# Patient Record
Sex: Male | Born: 1955 | Race: White | Hispanic: No | Marital: Married | Smoking: Never smoker
Health system: Southern US, Community
[De-identification: ages and names within clinical notes are randomized; demographics above are authoritative.]

## PROBLEM LIST (undated history)

## (undated) DIAGNOSIS — I471 Supraventricular tachycardia, unspecified: Secondary | ICD-10-CM

## (undated) DIAGNOSIS — I499 Cardiac arrhythmia, unspecified: Secondary | ICD-10-CM

## (undated) HISTORY — DX: Supraventricular tachycardia, unspecified: I47.10

## (undated) HISTORY — PX: EXTERNAL EAR SURGERY: SHX627

---

## 2020-12-31 ENCOUNTER — Emergency Department (HOSPITAL_BASED_OUTPATIENT_CLINIC_OR_DEPARTMENT_OTHER)
Admission: EM | Admit: 2020-12-31 | Discharge: 2021-01-01 | Disposition: A | Discharge status: Home / Self Care | Payer: BC Managed Care – PPO | Attending: Emergency Medicine | Admitting: Emergency Medicine

## 2020-12-31 ENCOUNTER — Encounter (HOSPITAL_BASED_OUTPATIENT_CLINIC_OR_DEPARTMENT_OTHER): Payer: Self-pay | Admitting: Emergency Medicine

## 2020-12-31 ENCOUNTER — Emergency Department (HOSPITAL_BASED_OUTPATIENT_CLINIC_OR_DEPARTMENT_OTHER): Payer: BC Managed Care – PPO

## 2020-12-31 ENCOUNTER — Other Ambulatory Visit: Payer: Self-pay

## 2020-12-31 DIAGNOSIS — R0602 Shortness of breath: Secondary | ICD-10-CM

## 2020-12-31 DIAGNOSIS — J069 Acute upper respiratory infection, unspecified: Secondary | ICD-10-CM | POA: Insufficient documentation

## 2020-12-31 HISTORY — DX: Cardiac arrhythmia, unspecified: I49.9

## 2020-12-31 LAB — COMPREHENSIVE METABOLIC PANEL
ALT: 81 U/L — ABNORMAL HIGH (ref 0–44)
AST: 45 U/L — ABNORMAL HIGH (ref 15–41)
Albumin: 3.4 g/dL — ABNORMAL LOW (ref 3.5–5.0)
Alkaline Phosphatase: 79 U/L (ref 38–126)
Anion gap: 13 (ref 5–15)
BUN: 8 mg/dL (ref 8–23)
CO2: 25 mmol/L (ref 22–32)
Calcium: 8.7 mg/dL — ABNORMAL LOW (ref 8.9–10.3)
Chloride: 98 mmol/L (ref 98–111)
Creatinine, Ser: 0.87 mg/dL (ref 0.61–1.24)
GFR, Estimated: >60 mL/min (ref >60)
Glucose, Bld: 125 mg/dL — ABNORMAL HIGH (ref 70–99)
Potassium: 3.5 mmol/L (ref 3.5–5.1)
Sodium: 136 mmol/L (ref 135–145)
Total Bilirubin: 0.9 mg/dL (ref 0.3–1.2)
Total Protein: 7.6 g/dL (ref 6.5–8.1)

## 2020-12-31 LAB — CBC
HCT: 41.5 % (ref 39.0–52.0)
Hemoglobin: 14.3 g/dL (ref 13.0–17.0)
MCH: 32.6 pg (ref 26.0–34.0)
MCHC: 34.5 g/dL (ref 30.0–36.0)
MCV: 94.5 fL (ref 80.0–100.0)
Platelets: 386 10*3/uL (ref 150–400)
RBC: 4.39 MIL/uL (ref 4.22–5.81)
RDW: 11.9 % (ref 11.5–15.5)
WBC: 8.6 10*3/uL (ref 4.0–10.5)
nRBC: 0 % (ref 0.0–0.2)

## 2020-12-31 LAB — D-DIMER, QUANTITATIVE: D-Dimer, Quant: 1.09 ug/mL-FEU — ABNORMAL HIGH (ref 0.00–0.50)

## 2020-12-31 LAB — TROPONIN I (HIGH SENSITIVITY): Troponin I (High Sensitivity): 6 ng/L (ref <18)

## 2020-12-31 MED ORDER — IOHEXOL 350 MG/ML SOLN
100.0000 mL | Freq: Once | INTRAVENOUS | Status: AC | PRN
  Administered 2020-12-31: 100 mL via INTRAVENOUS

## 2020-12-31 MED ORDER — ALBUTEROL SULFATE HFA 108 (90 BASE) MCG/ACT IN AERS: 2.0000 | INHALATION_SPRAY | RESPIRATORY_TRACT | Status: DC | PRN

## 2020-12-31 NOTE — ED Provider Notes (Signed)
[facility] EMERGENCY DEPARTMENT Provider Note   CSN: [id number] Arrival date & time: 12/31/20  1545     History Chief Complaint  Patient presents with  . Shortness of Breath    Travis Trujillo is a 65 y.o. male.  Patient presents with chief complaint of cough congestion and intermittent fevers.  He said Covid for more than 2 weeks now but has persistent cough and shortness of breath.  He was seen by his primary care doctor, sent here for further evaluation.  Denies any chest pain or abdominal pain.  States he has no appetite and feels generalized weakness.  Denies vomiting or diarrhea.        Past Medical History:  Diagnosis Date  . Irregular cardiac rhythm     There are no problems to display for this patient.      No family history on file.  Social History   Tobacco Use  . Smoking status: Never Smoker  . Smokeless tobacco: Never Used  Substance Use Topics  . Alcohol use: Never  . Drug use: Never    Home Medications Prior to Admission medications   Not on File    Allergies    Patient has no known allergies.  Review of Systems   Review of Systems  Constitutional: Positive for fever.  HENT: Negative for ear pain and sore throat.   Eyes: Negative for pain.  Respiratory: Positive for cough and shortness of breath.   Cardiovascular: Negative for chest pain.  Gastrointestinal: Negative for abdominal pain.  Genitourinary: Negative for flank pain.  Musculoskeletal: Negative for back pain.  Skin: Negative for color change and rash.  Neurological: Negative for syncope.  All other systems reviewed and are negative.   Physical Exam Updated Vital Signs BP 129/88   Pulse 96   Temp 98.5 F (36.9 C) (Oral)   Resp 17   Ht 5\' 10"  (1.778 m)   Wt 61.2 kg   SpO2 96%   BMI 19.37 kg/m   Physical Exam Constitutional:      General: He is not in acute distress.    Appearance: He is well-developed.  HENT:     Head: Normocephalic.     Nose: Nose  normal.  Eyes:     Extraocular Movements: Extraocular movements intact.  Cardiovascular:     Rate and Rhythm: Tachycardia present.  Pulmonary:     Effort: Pulmonary effort is normal.  Skin:    Coloration: Skin is not jaundiced.  Neurological:     Mental Status: He is alert. Mental status is at baseline.     ED Results / Procedures / Treatments   Labs (all labs ordered are listed, but only abnormal results are displayed) Labs Reviewed  COMPREHENSIVE METABOLIC PANEL - Abnormal; Notable for the following components:      Result Value   Glucose, Bld 125 (*)    Calcium 8.7 (*)    Albumin 3.4 (*)    AST 45 (*)    ALT 81 (*)    All other components within normal limits  D-DIMER, QUANTITATIVE (NOT AT [facility]) - Abnormal; Notable for the following components:   D-Dimer, Quant 1.09 (*)    All other components within normal limits  CBC  TROPONIN I (HIGH SENSITIVITY)  TROPONIN I (HIGH SENSITIVITY)    EKG None  Radiology CT Angio Chest PE W/Cm &/Or Wo Cm  Result Date: 12/31/2020 CLINICAL DATA:  Recent COVID infection.  Persistent cough and fever. EXAM: CT ANGIOGRAPHY CHEST WITH CONTRAST TECHNIQUE:  Multidetector CT imaging of the chest was performed using the standard protocol during bolus administration of intravenous contrast. Multiplanar CT image reconstructions and MIPs were obtained to evaluate the vascular anatomy. CONTRAST:  OMNIPAQUE IOHEXOL 350 MG/ML SOLN COMPARISON:  None. FINDINGS: Cardiovascular: Contrast injection is sufficient to demonstrate satisfactory opacification of the pulmonary arteries to the segmental level. There is no pulmonary embolus or evidence of right heart strain. The size of the main pulmonary artery is normal. Heart size is normal, with no pericardial effusion. The course and caliber of the aorta are normal. There is no atherosclerotic calcification. Opacification decreased due to pulmonary arterial phase contrast bolus timing. Mediastinum/Nodes: -- No  mediastinal lymphadenopathy. -- No hilar lymphadenopathy. -- No axillary lymphadenopathy. -- No supraclavicular lymphadenopathy. -- Normal thyroid gland where visualized. -  Unremarkable esophagus. Lungs/Pleura: Scattered ground-glass airspace opacities are noted, primarily within the lower lobes. There is no pneumothorax or large pleural effusion. There is a pulmonary nodule at the right lung apex measuring approximately 5 mm (axial series 5, image 15). Upper Abdomen: Contrast bolus timing is not optimized for evaluation of the abdominal organs. The visualized portions of the organs of the upper abdomen are normal. Musculoskeletal: No chest wall abnormality. No bony spinal canal stenosis. Review of the MIP images confirms the above findings. IMPRESSION: 1. No evidence of pulmonary embolus. 2. Scattered ground-glass airspace opacities, primarily within the lower lobes, consistent with an atypical infectious process such as viral pneumonia. 3. There is a 5 mm pulmonary nodule at the right lung apex. No follow-up needed if patient is low-risk. Non-contrast chest CT can be considered in 12 months if patient is high-risk. This recommendation follows the consensus statement: Guidelines for Management of Incidental Pulmonary Nodules Detected on CT Images: From the Fleischner Society 2017; Radiology 2017; 284:228-243. Electronically Signed   By: Katherine Mantle M.D.   On: 12/31/2020 23:19   DG Chest Portable 1 View  Result Date: 12/31/2020 CLINICAL DATA:  Cough, COVID EXAM: PORTABLE CHEST 1 VIEW COMPARISON:  12/26/2020 FINDINGS: The heart size and mediastinal contours are within normal limits. Both lungs are clear. The visualized skeletal structures are unremarkable. IMPRESSION: Normal study. Electronically Signed   By: Charlett Nose M.D.   On: 12/31/2020 20:03    Procedures Procedures (including critical care time)  Medications Ordered in ED Medications  albuterol (VENTOLIN HFA) 108 (90 Base) MCG/ACT inhaler  2 puff (has no administration in time range)  iohexol (OMNIPAQUE) 350 MG/ML injection 100 mL (100 mLs Intravenous Contrast Given 12/31/20 2306)    ED Course  I have reviewed the triage vital signs and the nursing notes.  Pertinent labs & imaging results that were available during my care of the patient were reviewed by me and considered in my medical decision making (see chart for details).    MDM Rules/Calculators/A&P                          Patient with recent Covid diagnosis with ongoing symptoms greater than 2 weeks.  D-dimer is elevated unclear if due to Covid or other etiology.  CT angio pursued no evidence of pulmonary embolism.  Patient otherwise has normal vital signs blood pressure 120 systolic and O2 saturation 97% room air which is appropriate.  Will recommend continued follow-up with his primary care doctor and management on outpatient basis.  Advised immediate return if he has trouble breathing pain fevers or any additional concerns or worsening symptoms.   Final Clinical  Impression(s) / ED Diagnoses Final diagnoses:  Shortness of breath  Viral URI    Rx / DC Orders ED Discharge Orders    None       Cheryll Cockayne, MD 12/31/20 2355

## 2020-12-31 NOTE — ED Triage Notes (Signed)
States is 15 days post dx of covid  And saw dr today and was told he has pneumonia , pt still has cough and fever and has a lot of nausea

## 2020-12-31 NOTE — ED Notes (Signed)
Patient denies pain and is resting comfortably.  

## 2020-12-31 NOTE — Discharge Instructions (Addendum)
Call your primary care doctor or specialist as discussed in the next 2-3 days.   Return immediately back to the ER if:  Your symptoms worsen within the next 12-24 hours. You develop new symptoms such as new fevers, persistent vomiting, new pain, shortness of breath, or new weakness or numbness, or if you have any other concerns.  

## 2021-08-27 IMAGING — CT CT ANGIO CHEST
2 of 8 series · 18 of 36 positions shown · IV contrast (Omnipaque)
Comparison: None.

CLINICAL DATA: Recent COVID infection.  Persistent cough and fever.

EXAM:
CT ANGIOGRAPHY CHEST WITH CONTRAST
TECHNIQUE: Multidetector CT imaging of the chest was performed using the
standard protocol during bolus administration of intravenous
contrast. Multiplanar CT image reconstructions and MIPs were
obtained to evaluate the vascular anatomy.
CONTRAST:  100mL OMNIPAQUE IOHEXOL 350 MG/ML SOLN

[Series 6: pe coronal mpr · coronal · 0.69mm/px · 1 of 134 slices shown]
[im 67/134  mediastinal]
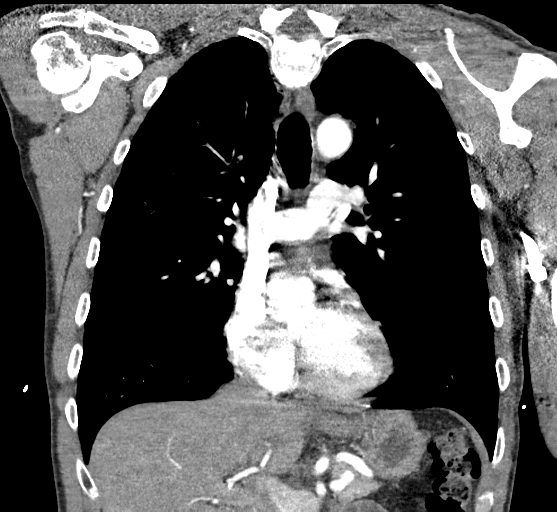

[Series 10: pe thins · axial · 0.67mm/px · z∈[-265,+48]mm · 17 of 351 slices shown]
[im 19/351  lung]
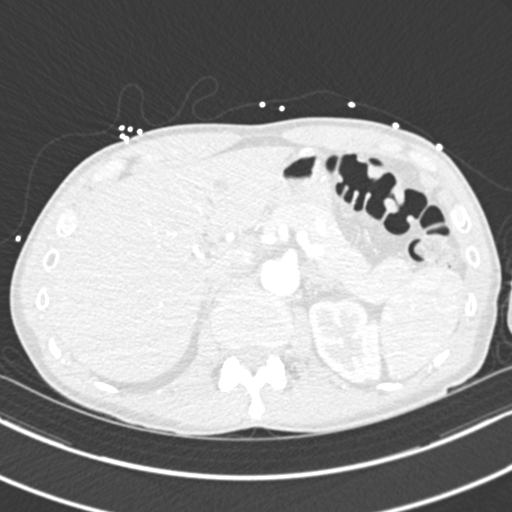
[im 37/351  mediastinal]
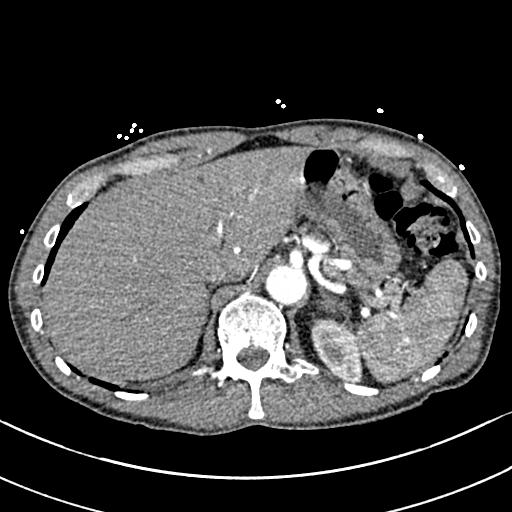
[im 56/351  lung]
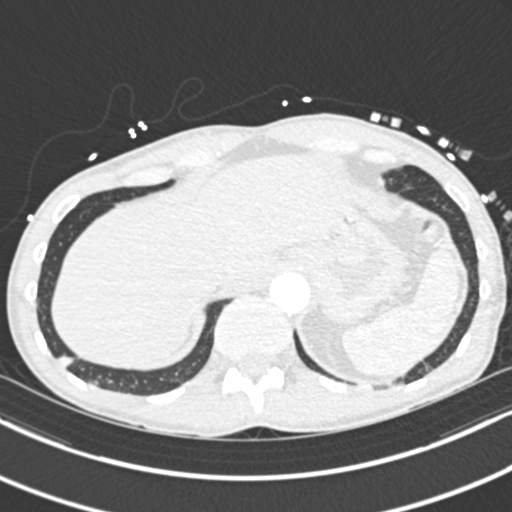
[im 74/351  mediastinal]
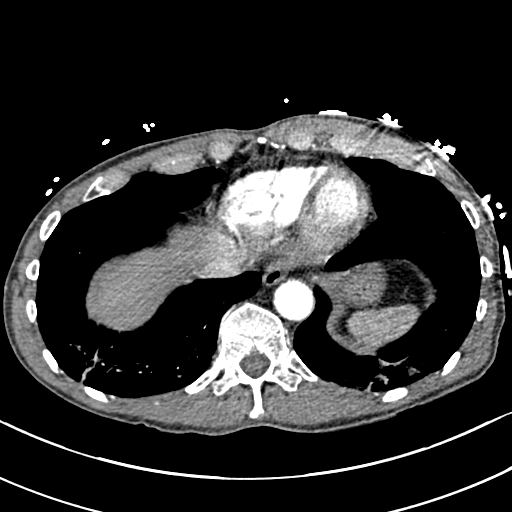
[im 93/351  lung]
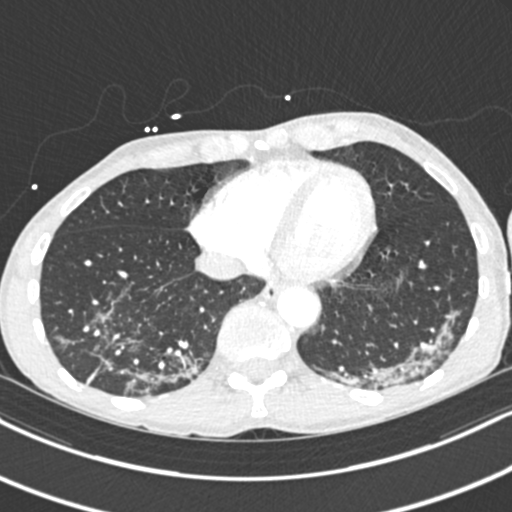
[im 111/351  mediastinal]
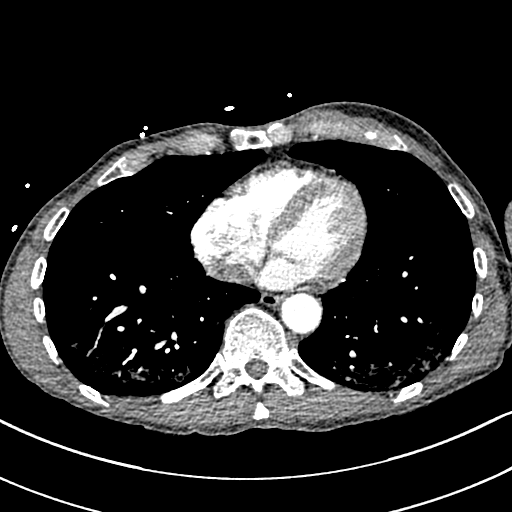
[im 129/351  lung]
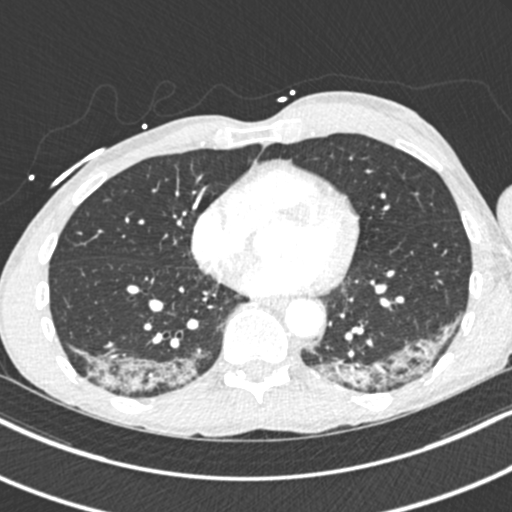
[im 148/351  mediastinal]
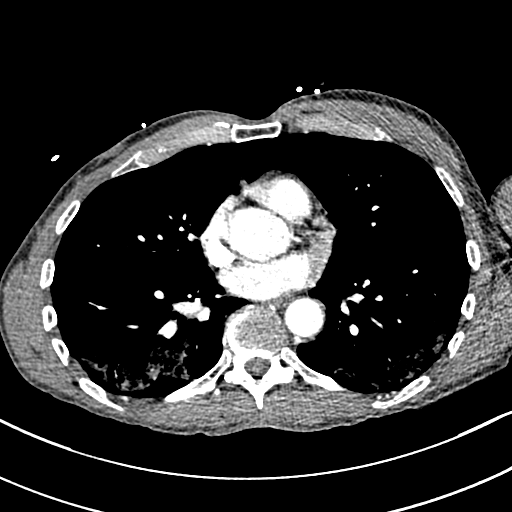
[im 185/351  lung]
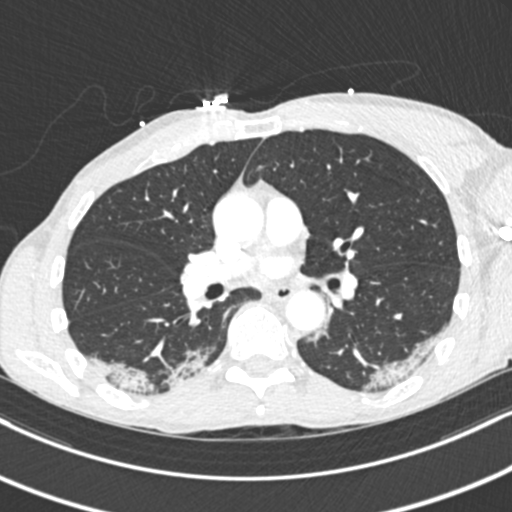
[im 203/351  mediastinal]
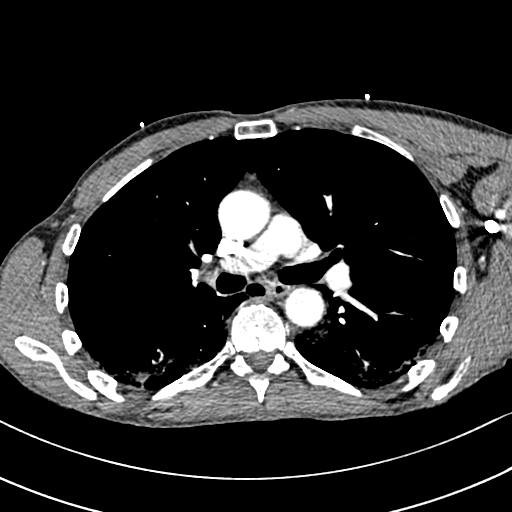
[im 222/351  lung]
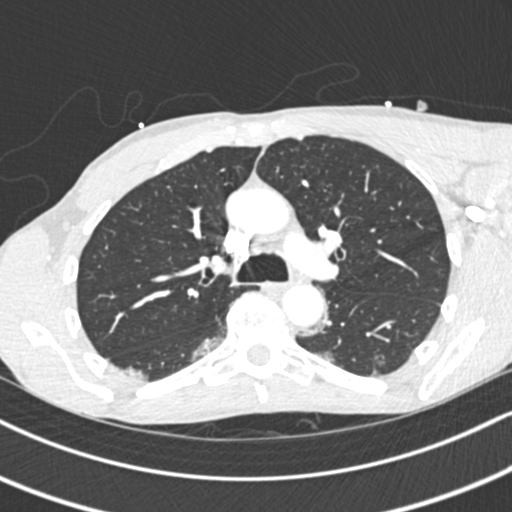
[im 240/351  mediastinal]
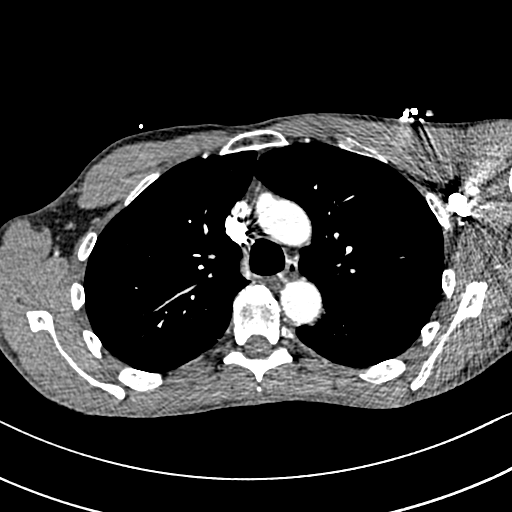
[im 258/351  lung]
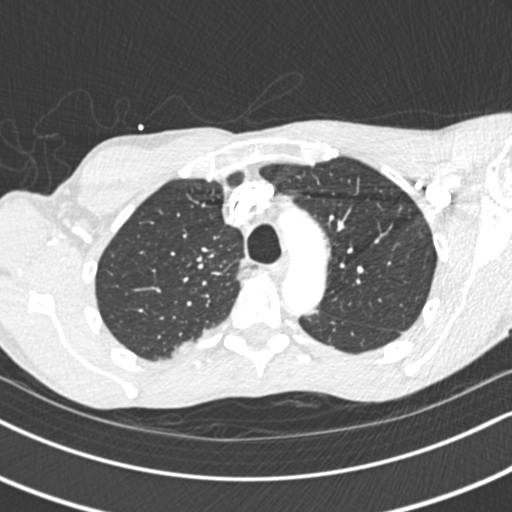
[im 277/351  mediastinal]
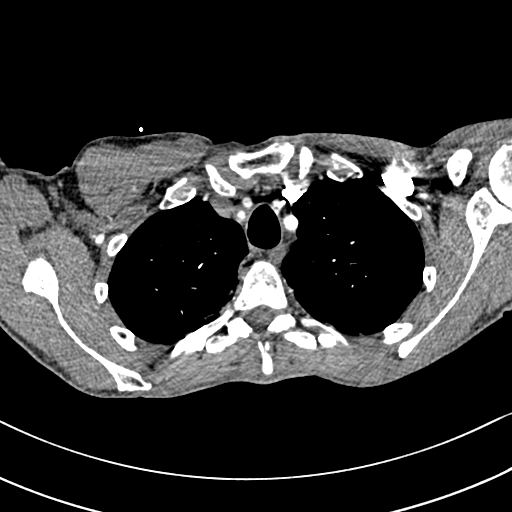
[im 295/351  lung]
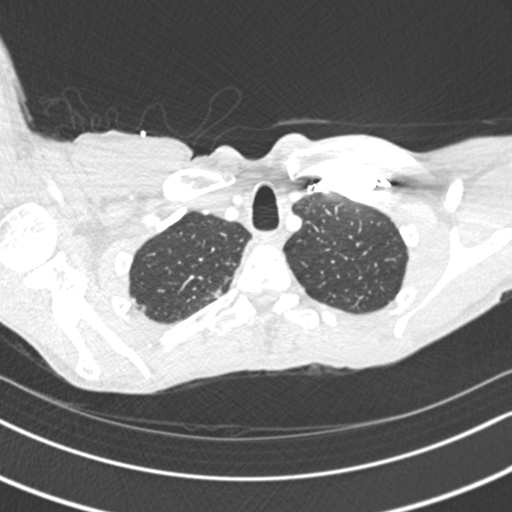
[im 314/351  mediastinal]
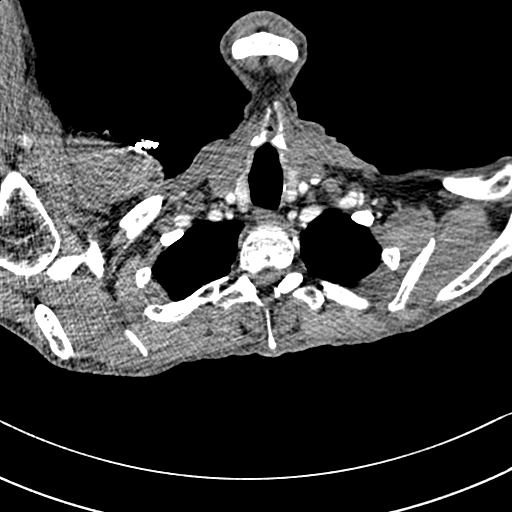
[im 332/351  lung]
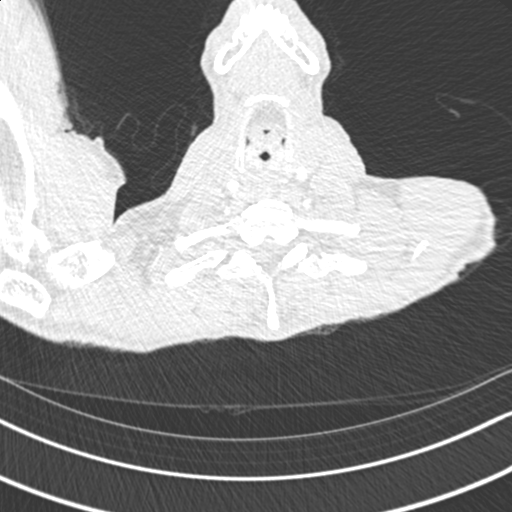

[18 of 36 positions shown; findings below may reference images not displayed]

FINDINGS: Cardiovascular: Contrast injection is sufficient to demonstrate
satisfactory opacification of the pulmonary arteries to the
segmental level. There is no pulmonary embolus or evidence of right
heart strain. The size of the main pulmonary artery is normal. Heart
size is normal, with no pericardial effusion. The course and caliber
of the aorta are normal. There is no atherosclerotic calcification.
Opacification decreased due to pulmonary arterial phase contrast
bolus timing.

Mediastinum/Nodes:

-- No mediastinal lymphadenopathy.

-- No hilar lymphadenopathy.

-- No axillary lymphadenopathy.

-- No supraclavicular lymphadenopathy.

-- Normal thyroid gland where visualized.

-  Unremarkable esophagus.

Lungs/Pleura: Scattered ground-glass airspace opacities are noted,
primarily within the lower lobes. There is no pneumothorax or large
pleural effusion. There is a pulmonary nodule at the right lung apex
measuring approximately 5 mm (axial series 5, image 15).

Upper Abdomen: Contrast bolus timing is not optimized for evaluation
of the abdominal organs. The visualized portions of the organs of
the upper abdomen are normal.

Musculoskeletal: No chest wall abnormality. No bony spinal canal
stenosis.

Review of the MIP images confirms the above findings.
IMPRESSION: 1. No evidence of pulmonary embolus.
2. Scattered ground-glass airspace opacities, primarily within the
lower lobes, consistent with an atypical infectious process such as
viral pneumonia.
3. There is a 5 mm pulmonary nodule at the right lung apex. No
follow-up needed if patient is low-risk. Non-contrast chest CT can
be considered in 12 months if patient is high-risk. This
recommendation follows the consensus statement: Guidelines for
Management of Incidental Pulmonary Nodules Detected on CT Images:

## 2021-10-02 DIAGNOSIS — R0789 Other chest pain: Secondary | ICD-10-CM

## 2021-10-02 DIAGNOSIS — K2900 Acute gastritis without bleeding: Secondary | ICD-10-CM | POA: Insufficient documentation

## 2021-10-02 DIAGNOSIS — B372 Candidiasis of skin and nail: Secondary | ICD-10-CM

## 2021-10-02 HISTORY — DX: Other chest pain: R07.89

## 2021-10-02 HISTORY — DX: Acute gastritis without bleeding: K29.00

## 2021-10-02 HISTORY — DX: Candidiasis of skin and nail: B37.2

## 2021-12-24 DIAGNOSIS — Z800 Family history of malignant neoplasm of digestive organs: Secondary | ICD-10-CM | POA: Diagnosis not present

## 2021-12-24 DIAGNOSIS — Z13220 Encounter for screening for lipoid disorders: Secondary | ICD-10-CM | POA: Diagnosis not present

## 2021-12-24 DIAGNOSIS — Z13228 Encounter for screening for other metabolic disorders: Secondary | ICD-10-CM | POA: Diagnosis not present

## 2021-12-24 DIAGNOSIS — Z8249 Family history of ischemic heart disease and other diseases of the circulatory system: Secondary | ICD-10-CM | POA: Diagnosis not present

## 2021-12-24 DIAGNOSIS — Z8719 Personal history of other diseases of the digestive system: Secondary | ICD-10-CM | POA: Diagnosis not present

## 2021-12-24 DIAGNOSIS — Z23 Encounter for immunization: Secondary | ICD-10-CM | POA: Diagnosis not present

## 2021-12-24 DIAGNOSIS — Z125 Encounter for screening for malignant neoplasm of prostate: Secondary | ICD-10-CM | POA: Diagnosis not present

## 2021-12-24 DIAGNOSIS — Z823 Family history of stroke: Secondary | ICD-10-CM | POA: Diagnosis not present

## 2021-12-24 DIAGNOSIS — Z0000 Encounter for general adult medical examination without abnormal findings: Secondary | ICD-10-CM | POA: Diagnosis not present

## 2022-01-21 DIAGNOSIS — Z23 Encounter for immunization: Secondary | ICD-10-CM | POA: Diagnosis not present

## 2023-10-07 ENCOUNTER — Encounter: Payer: Self-pay | Admitting: Cardiology

## 2023-10-07 ENCOUNTER — Encounter: Payer: Self-pay | Admitting: *Deleted

## 2023-10-13 DIAGNOSIS — I499 Cardiac arrhythmia, unspecified: Secondary | ICD-10-CM | POA: Insufficient documentation

## 2023-10-13 DIAGNOSIS — I471 Supraventricular tachycardia, unspecified: Secondary | ICD-10-CM | POA: Insufficient documentation

## 2023-10-14 NOTE — Progress Notes (Unsigned)
Cardiology Office Note:    Date:  10/15/2023   ID:  Travis Trujillo, DOB 25-Dec-1955, MRN [id number]  PCP:  Hadley Pen, MD  Cardiologist:  Norman Herrlich, MD   Referring MD: Hadley Pen, MD  ASSESSMENT:    1. Palpitations   2. SVT (supraventricular tachycardia) (HCC)   3. Precordial pain   4. SOB (shortness of breath)   5. Syncope and collapse   6. Incomplete RBBB    PLAN:    In order of problems listed above:  He has a complicated history including rapid heartbeat on unsure whether he has clinical SVT, for me to see if I can access that monitor he had 2021 through [facility] Most recently had a profound syncopal episode quite suggestive of bradycardia He is having prolonged episodes of irregular heartbeat with weakness and hypotension quite suggestive of atrial fibrillation and may be having sinus pauses converting from atrial fibrillation This is superimposed on cardiovascular symptoms including chest pain and exertional shortness of breath In order to capture an episode I advise either an implanted loop recorder or a uses the Kardia mobile device he prefers the latter and will start sign up with my chart and send the episodes if you are unable to capture he will need a loop recorder He needs an echocardiogram to assess for structural heart disease with chest pain and shortness of breath He needs an ischemia evaluation with cardiac CTA He also has incomplete right bundle branch block raising concern for structural heart disease Next appointment 3 months   Medication Adjustments/Labs and Tests Ordered: Current medicines are reviewed at length with the patient today.  Concerns regarding medicines are outlined above.  Orders Placed This Encounter  Procedures   CT CORONARY MORPH W/CTA COR W/SCORE W/CA W/CM &/OR WO/CM   Basic Metabolic Panel (BMET)   EKG 12-Lead   ECHOCARDIOGRAM COMPLETE   Meds ordered this encounter  Medications   metoprolol  tartrate (LOPRESSOR) 100 MG tablet    Sig: Take 1 tablet (100 mg total) by mouth once for 1 dose. Please take this medication 2 hours before CT.    Dispense:  1 tablet    Refill:  0     History of Present Illness:    Travis Trujillo is a 67 y.o. male who is being seen today for the evaluation of SVT at the request of Hadley Pen, MD.  He was seen with his primary care physician 09/30/23 with episodes of lightheadedness and a background history of monitor detected SVT.  He previously had taken a beta-blocker until July when he had an episode of syncope and it was discontinued.  Recent labs 09/30/2023 showed potassium 4.6 sodium 138 creatinine 1.24 GFR 64 cc/min he had mild CKD hemoglobin 15.4 platelets 231,000  His cardiac history begins in 2021 he had an episode of rapid irregular heartbeat subsequently wore monitor his wife was good healthcare knowledge 12 May showed SVT put on beta-blocker and had no episodes until July of this year July of this year after vigorous activity he felt weak he lost consciousness had amnesia for the episode he had bruising and apparently was quite a distance from where he initiated and has no recall of events.  He did not have any ictal activity did not bite his tongue and did not have incontinence he has no history of head trauma or seizure disorder and really no known history of heart disease He is quite vigorous and athletic at times he  gets nonexertional chest pain he describes as tightness attributed to GERD and at times he has exertional shortness of breath that he is able to walk through He has not had an ischemia or cardiomyopathy evaluation He is having intermittent episodes that can last up to a day worse pulse is rapid blood pressure is as low as 85 systolic and he feels quite weak He and his wife are concerned he is having atrial fibrillation. She has an Scientist, physiological for self but has not set it up for EKG.  From office note his PCP 11/05/2020 his  monitor showed 6 short runs of APCs the longest 17 beats in duration was placed on a beta-blocker. Past Medical History:  Diagnosis Date   Acute gastritis without hemorrhage 10/02/2021   Atypical chest pain 10/02/2021   Irregular cardiac rhythm    SVT (supraventricular tachycardia) (HCC)    Yeast dermatitis 10/02/2021    Past Surgical History:  Procedure Laterality Date   EXTERNAL EAR SURGERY      Current Medications: Current Meds  Medication Sig   aspirin EC 81 MG tablet Take 81 mg by mouth daily.   famotidine (PEPCID) 40 MG tablet Take 40 mg by mouth daily.   metoprolol tartrate (LOPRESSOR) 100 MG tablet Take 1 tablet (100 mg total) by mouth once for 1 dose. Please take this medication 2 hours before CT.   Omega-3 1000 MG CAPS Take 1 capsule by mouth daily.   zinc gluconate 50 MG tablet Take 50 mg by mouth daily.     Allergies:   Patient has no known allergies.   Social History   Socioeconomic History   Marital status: Married    Spouse name: Not on file   Number of children: Not on file   Years of education: Not on file   Highest education level: Not on file  Occupational History   Not on file  Tobacco Use   Smoking status: Never   Smokeless tobacco: Never  Substance and Sexual Activity   Alcohol use: Never   Drug use: Never   Sexual activity: Not on file  Other Topics Concern   Not on file  Social History Narrative   Not on file   Social Determinants of Health   Financial Resource Strain: Not on file  Food Insecurity: Low Risk  (09/30/2023)   Received from [facility]   Hunger Vital Sign    Worried About Running Out of Food in the Last Year: Never true    Ran Out of Food in the Last Year: Never true  Transportation Needs: No Transportation Needs (09/30/2023)   Received from [facility]    In the past 12 months, has lack of reliable transportation kept you from medical appointments, meetings, work or from getting things needed for  daily living? : No  Physical Activity: Not on file  Stress: Not on file  Social Connections: Not on file     Family History: The patient's family history includes Colon cancer in his father and paternal grandfather; Hypertension in his mother; Stroke in his mother.  ROS:   ROS Please see the history of present illness.     All other systems reviewed and are negative.  EKGs/Labs/Other Studies Reviewed:    The following studies were reviewed today:  EKG Interpretation Date/Time:  Thursday October 15 2023 08:38:52 EDT Ventricular Rate:  87 PR Interval:  138 QRS Duration:  96 QT Interval:  348 QTC Calculation: 418 R Axis:  79  Text Interpretation: Normal sinus rhythm Incomplete right bundle branch block No previous ECGs available Confirmed by Norman Herrlich ([id number]) on 10/15/2023 9:24:41 AM    Physical Exam:    VS:  BP 116/80   Pulse 87   Ht 5\' 10"  (1.778 m)   Wt 150 lb 3.2 oz (68.1 kg)   SpO2 95%   BMI 21.55 kg/m     Wt Readings from Last 3 Encounters:  10/15/23 150 lb 3.2 oz (68.1 kg)  09/30/23 149 lb (67.6 kg)  12/31/20 135 lb (61.2 kg)     GEN:  Well nourished, well developed in no acute distress HEENT: Normal NECK: No JVD; No carotid bruits LYMPHATICS: No lymphadenopathy CARDIAC: RRR, no murmurs, rubs, gallops RESPIRATORY:  Clear to auscultation without rales, wheezing or rhonchi  ABDOMEN: Soft, non-tender, non-distended MUSCULOSKELETAL:  No edema; No deformity  SKIN: Warm and dry NEUROLOGIC:  Alert and oriented x 3 PSYCHIATRIC:  Normal affect     Signed, Norman Herrlich, MD  10/15/2023 9:29 AM    [facility]

## 2023-10-15 ENCOUNTER — Ambulatory Visit: Payer: PPO | Attending: Cardiology | Admitting: Cardiology

## 2023-10-15 ENCOUNTER — Encounter: Payer: Self-pay | Admitting: Cardiology

## 2023-10-15 VITALS — BP 116/80 | HR 87 | Ht 70.0 in | Wt 150.2 lb

## 2023-10-15 DIAGNOSIS — R072 Precordial pain: Secondary | ICD-10-CM

## 2023-10-15 DIAGNOSIS — R002 Palpitations: Secondary | ICD-10-CM

## 2023-10-15 DIAGNOSIS — R55 Syncope and collapse: Secondary | ICD-10-CM

## 2023-10-15 DIAGNOSIS — I4510 Unspecified right bundle-branch block: Secondary | ICD-10-CM

## 2023-10-15 DIAGNOSIS — R0602 Shortness of breath: Secondary | ICD-10-CM

## 2023-10-15 DIAGNOSIS — I471 Supraventricular tachycardia, unspecified: Secondary | ICD-10-CM

## 2023-10-15 LAB — BASIC METABOLIC PANEL
BUN/Creatinine Ratio: 18 (ref 10–24)
BUN: 20 mg/dL (ref 8–27)
CO2: 27 mmol/L (ref 20–29)
Calcium: 9.6 mg/dL (ref 8.6–10.2)
Chloride: 103 mmol/L (ref 96–106)
Creatinine, Ser: 1.1 mg/dL (ref 0.76–1.27)
Glucose: 90 mg/dL (ref 70–99)
Potassium: 4.9 mmol/L (ref 3.5–5.2)
Sodium: 139 mmol/L (ref 134–144)
eGFR: 74 mL/min/{1.73_m2} (ref >59)

## 2023-10-15 MED ORDER — METOPROLOL TARTRATE 100 MG PO TABS: 100.0000 mg | ORAL_TABLET | Freq: Once | ORAL | 0 refills | Status: DC

## 2023-10-15 NOTE — Patient Instructions (Signed)
Medication Instructions:  Your physician recommends that you continue on your current medications as directed. Please refer to the Current Medication list given to you today.  *If you need a refill on your cardiac medications before your next appointment, please call your pharmacy*   Lab Work: Your physician recommends that you return for lab work in:   Labs today: BMP  If you have labs (blood work) drawn today and your tests are completely normal, you will receive your results only by: MyChart Message (if you have MyChart) OR A paper copy in the mail If you have any lab test that is abnormal or we need to change your treatment, we will call you to review the results.   Testing/Procedures: Your physician has requested that you have an echocardiogram. Echocardiography is a painless test that uses sound waves to create images of your heart. It provides your doctor with information about the size and shape of your heart and how well your heart's chambers and valves are working. This procedure takes approximately one hour. There are no restrictions for this procedure. Please do NOT wear cologne, perfume, aftershave, or lotions (deodorant is allowed). Please arrive 15 minutes prior to your appointment time.    Your cardiac CT will be scheduled at one of the below locations:   [facility] [address] [place], [place] [address] [phone]  OR  [facility] [address] [place], [place] [address] [phone]  OR   [facility] [address] [place], [place] [address] [phone]  If scheduled at [facility], please arrive at the [facility] and Children's Entrance (Entrance C2) of [facility] 30 minutes prior to test start time. You can use the FREE valet parking offered at entrance C (encouraged to control the heart rate for the test)  Proceed to the [facility] Radiology  Department (first floor) to check-in and test prep.  All radiology patients and guests should use entrance C2 at [facility], accessed from [facility], even though the hospital's physical address listed is [address]    If scheduled at [facility] or [facility], please arrive 15 mins early for check-in and test prep.  There is spacious parking and easy access to the radiology department from the [facility] V[facility] entrance. Please enter here and check-in with the desk attendant.   Please follow these instructions carefully (unless otherwise directed):  An IV will be required for this test and Nitroglycerin will be given.  Hold all erectile dysfunction medications at least 3 days (72 hrs) prior to test. (Ie viagra, cialis, sildenafil, tadalafil, etc)   On the Night Before the Test: Be sure to Drink plenty of water. Do not consume any caffeinated/decaffeinated beverages or chocolate 12 hours prior to your test. Do not take any antihistamines 12 hours prior to your test.  On the Day of the Test: Drink plenty of water until 1 hour prior to the test. Do not eat any food 1 hour prior to test. You may take your regular medications prior to the test.  Take metoprolol (Lopressor) two hours prior to test.      After the Test: Drink plenty of water. After receiving IV contrast, you may experience a mild flushed feeling. This is normal. On occasion, you may experience a mild rash up to 24 hours after the test. This is not dangerous. If this occurs, you  can take Benadryl 25 mg and increase your fluid intake. If you experience trouble breathing, this can be serious. If it is severe call 911 IMMEDIATELY. If it is mild, please call our office.  We will call to schedule your test 2-4 weeks out understanding that some insurance companies will need an authorization prior to the service being performed.   For more  information and frequently asked questions, please visit our website : [email or web address]  For non-scheduling related questions, please contact the cardiac imaging nurse navigator should you have any questions/concerns: Cardiac Imaging Nurse Navigators Direct Office Dial: [phone]   For scheduling needs, including cancellations and rescheduling, please call [place], [phone].    Follow-Up: At [facility], you and your health needs are our priority.  As part of our continuing mission to provide you with exceptional heart care, we have created designated Provider Care Teams.  These Care Teams include your primary Cardiologist (physician) and Advanced Practice Providers (APPs -  Physician Assistants and Nurse Practitioners) who all work together to provide you with the care you need, when you need it.  We recommend signing up for the patient portal called "MyChart".  Sign up information is provided on this After Visit Summary.  MyChart is used to connect with patients for Virtual Visits (Telemedicine).  Patients are able to view lab/test results, encounter notes, upcoming appointments, etc.  Non-urgent messages can be sent to your provider as well.   To learn more about what you can do with MyChart, go to [email or web address].    Your next appointment:   3 month(s)  Provider:   Norman Herrlich, MD    Other Instructions None

## 2023-10-29 ENCOUNTER — Encounter (HOSPITAL_COMMUNITY): Payer: Self-pay

## 2023-11-03 ENCOUNTER — Ambulatory Visit (HOSPITAL_COMMUNITY)
Admission: RE | Admit: 2023-11-03 | Discharge: 2023-11-03 | Disposition: A | Discharge status: Home / Self Care | Payer: PPO | Source: Ambulatory Visit | Attending: Cardiology | Admitting: Cardiology

## 2023-11-03 DIAGNOSIS — R072 Precordial pain: Secondary | ICD-10-CM | POA: Insufficient documentation

## 2023-11-03 DIAGNOSIS — R931 Abnormal findings on diagnostic imaging of heart and coronary circulation: Secondary | ICD-10-CM | POA: Insufficient documentation

## 2023-11-03 DIAGNOSIS — I2510 Atherosclerotic heart disease of native coronary artery without angina pectoris: Secondary | ICD-10-CM | POA: Diagnosis not present

## 2023-11-03 MED ORDER — IOPAMIDOL (ISOVUE-370) INJECTION 76%
100.0000 mL | Freq: Once | INTRAVENOUS | Status: AC | PRN
  Administered 2023-11-03: 100 mL via INTRAVENOUS

## 2023-11-03 MED ORDER — NITROGLYCERIN 0.4 MG SL SUBL
SUBLINGUAL_TABLET | SUBLINGUAL | Status: AC
  Filled 2023-11-03: qty 2

## 2023-11-03 MED ORDER — NITROGLYCERIN 0.4 MG SL SUBL
0.8000 mg | SUBLINGUAL_TABLET | Freq: Once | SUBLINGUAL | Status: AC
  Administered 2023-11-03: 0.8 mg via SUBLINGUAL

## 2023-11-04 ENCOUNTER — Telehealth: Payer: Self-pay | Admitting: Cardiology

## 2023-11-04 ENCOUNTER — Encounter: Payer: Self-pay | Admitting: Cardiology

## 2023-11-04 ENCOUNTER — Other Ambulatory Visit: Payer: Self-pay | Admitting: Cardiology

## 2023-11-04 ENCOUNTER — Ambulatory Visit (HOSPITAL_BASED_OUTPATIENT_CLINIC_OR_DEPARTMENT_OTHER)
Admission: RE | Admit: 2023-11-04 | Discharge: 2023-11-04 | Disposition: A | Discharge status: Home / Self Care | Payer: PPO | Source: Ambulatory Visit | Attending: Cardiology | Admitting: Cardiology

## 2023-11-04 ENCOUNTER — Other Ambulatory Visit: Payer: Self-pay

## 2023-11-04 DIAGNOSIS — R931 Abnormal findings on diagnostic imaging of heart and coronary circulation: Secondary | ICD-10-CM | POA: Diagnosis not present

## 2023-11-04 DIAGNOSIS — R072 Precordial pain: Secondary | ICD-10-CM

## 2023-11-04 MED ORDER — ROSUVASTATIN CALCIUM 20 MG PO TABS: 20.0000 mg | ORAL_TABLET | Freq: Every day | ORAL | 3 refills | Status: DC

## 2023-11-04 NOTE — Telephone Encounter (Signed)
Pt would like a c/b in regards to recent test results as to he has questions. Please advise

## 2023-11-06 NOTE — Telephone Encounter (Signed)
Called patient and informed him of Dr. Hulen Shouts interpretation of his Cardiac CTA. Patient verbalized understanding and had no further questions at this time.

## 2023-11-11 ENCOUNTER — Other Ambulatory Visit: Payer: Self-pay

## 2023-11-18 ENCOUNTER — Ambulatory Visit: Payer: PPO | Attending: Cardiology

## 2023-11-18 DIAGNOSIS — R55 Syncope and collapse: Secondary | ICD-10-CM

## 2023-11-18 DIAGNOSIS — R002 Palpitations: Secondary | ICD-10-CM

## 2023-11-18 DIAGNOSIS — I471 Supraventricular tachycardia, unspecified: Secondary | ICD-10-CM

## 2023-11-18 DIAGNOSIS — R072 Precordial pain: Secondary | ICD-10-CM

## 2023-11-18 DIAGNOSIS — R0602 Shortness of breath: Secondary | ICD-10-CM | POA: Diagnosis not present

## 2023-11-18 LAB — ECHOCARDIOGRAM COMPLETE
Area-P 1/2: 3.26 cm2
S' Lateral: 3.4 cm

## 2024-01-06 ENCOUNTER — Other Ambulatory Visit: Payer: Self-pay

## 2024-01-06 DIAGNOSIS — E785 Hyperlipidemia, unspecified: Secondary | ICD-10-CM

## 2024-01-06 LAB — LIPID PANEL
Chol/HDL Ratio: 2.5 {ratio} (ref 0.0–5.0)
Cholesterol, Total: 144 mg/dL (ref 100–199)
HDL: 57 mg/dL (ref >39)
LDL Chol Calc (NIH): 72 mg/dL (ref 0–99)
Triglycerides: 80 mg/dL (ref 0–149)
VLDL Cholesterol Cal: 15 mg/dL (ref 5–40)

## 2024-01-06 LAB — APOLIPOPROTEIN B: Apolipoprotein B: 67 mg/dL (ref <90)

## 2024-01-06 LAB — LIPOPROTEIN A (LPA): Lipoprotein (a): 111.3 nmol/L — ABNORMAL HIGH (ref <75.0)

## 2024-01-06 MED ORDER — REPATHA SURECLICK 140 MG/ML ~~LOC~~ SOAJ: 140.0000 mg | SUBCUTANEOUS | 12 refills | Status: DC

## 2024-01-14 ENCOUNTER — Encounter: Payer: Self-pay | Admitting: Cardiology

## 2024-01-14 NOTE — Progress Notes (Signed)
Cardiology Office Note:    Date:  01/15/2024   ID:  Travis Trujillo, DOB 01-06-1956, MRN [id number]  PCP:  Hadley Pen, MD  Cardiologist:  Norman Herrlich, MD    Referring MD: Hadley Pen, MD    ASSESSMENT:    1. Coronary artery disease of native artery of native heart with stable angina pectoris (HCC)   2. Agatston coronary artery calcium score less than 100   3. Elevated lipoprotein(a)   4. PAT (paroxysmal atrial tachycardia) (HCC)   5. Incomplete RBBB    PLAN:    In order of problems listed above:  Fortunately has mild nonobstructive CAD I think the key here is treating his LP(a) dyslipidemia he would benefit from combined statin and Repatha if unable to get preapproval Zetia as an option I would like to see his ApoB under 60 LP(a) under 50-55 and he tells me he has an upcoming wellness exam with his PCP i.e. ideally should wait 2 months to recheck his lipids   Next appointment: I will plan to see him 1 year particularly as new agents are coming online for LP(a)   Medication Adjustments/Labs and Tests Ordered: Current medicines are reviewed at length with the patient today.  Concerns regarding medicines are outlined above.  Orders Placed This Encounter  Procedures   Lipid Profile   Lipoprotein A (LPA)   Apolipoprotein B   No orders of the defined types were placed in this encounter.    History of Present Illness:    Travis Trujillo is a 68 y.o. male with a hx of palpitation brief atrial tachycardia on the monitor episode of syncope incomplete right bundle branch and chest pain last seen 10/15/2023.  Subsequent evaluation showed the presence of CAD and hyperlipidemia with elevated LP(a) and is currently on both a high intensity statin and Repatha.  Following the visit he had a cardiac CTA reported 11/04/2023 he had a coronary calcium score 42 which is 42nd percentile for age and sex plaque volume 22nd percentile and he had moderate nonflow limiting stenosis in the  left anterior descending coronary artery prior to the first diagonal branch 50 to 69% FFR normal.  His echocardiogram performed 11/18/2023 showed normal left ventricular size and function.  Size wall thickness systolic function EF 60 to 65% normal diastolic function normal GLS.  Right ventricle normal size function and pulmonary artery pressure there is mild left atrial enlargement.  His lipid profile 2 weeks ago showed elevated LP(a) at 100 and his APO B was relatively low at 25 with an LDL of 72 and he is advised use of PCSK9 inhibitor  Compliance with diet, lifestyle and medications: Yes  His wife is present participates in evaluation decision making Is a great deal of research about lipid disorders and LP(a) i advised Repatha and he alerts me he needs preapproval He tolerated his statin without muscle pain or weakness We reviewed his CTA and echocardiogram Not having chest pain edema shortness of breath Past Medical History:  Diagnosis Date   Acute gastritis without hemorrhage 10/02/2021   Atypical chest pain 10/02/2021   Irregular cardiac rhythm    SVT (supraventricular tachycardia) (HCC)    Yeast dermatitis 10/02/2021    Current Medications: Current Meds  Medication Sig   aspirin EC 81 MG tablet Take 81 mg by mouth daily.   Evolocumab (REPATHA SURECLICK) 140 MG/ML SOAJ Inject 140 mg into the skin every 14 (fourteen) days.   famotidine (PEPCID) 40 MG tablet Take 40 mg by  mouth daily.   Omega-3 1000 MG CAPS Take 1 capsule by mouth daily.   rosuvastatin (CRESTOR) 20 MG tablet Take 1 tablet (20 mg total) by mouth daily.   zinc gluconate 50 MG tablet Take 50 mg by mouth daily.   [DISCONTINUED] metoprolol tartrate (LOPRESSOR) 100 MG tablet Take 1 tablet (100 mg total) by mouth once for 1 dose. Please take this medication 2 hours before CT.      EKGs/Labs/Other Studies Reviewed:    The following studies were reviewed today:  Cardiac Studies & Procedures       ECHOCARDIOGRAM  ECHOCARDIOGRAM COMPLETE 11/18/2023  Narrative ECHOCARDIOGRAM REPORT    Patient Name:   Travis Trujillo Date of Exam: 11/18/2023 Medical Rec #:  [id number]    Height:       70.0 in Accession #:    [id number]   Weight:       150.2 lb Date of Birth:  1956/07/07   BSA:          1.848 m Patient Age:    67 years     BP:           99/77 mmHg Patient Gender: M            HR:           80 bpm. Exam Location:  [place]  Procedure: 2D Echo, Cardiac Doppler, Color Doppler and Strain Analysis  Indications:    SVT (supraventricular tachycardia) (HCC) [I47.10 (ICD-10-CM)]; Precordial pain [R07.2 (ICD-10-CM)]; SOB (shortness of breath) [R06.02 (ICD-10-CM)]; Syncope and collapse Vera.August (ICD-10-CM)]; Palpitations [R00.2 (ICD-10-CM)]  History:        Patient has no prior history of Echocardiogram examinations. Arrythmias:SVT and RBBB; Signs/Symptoms:Shortness of Breath and Syncope.  Sonographer:    Margreta Journey RDCS Referring Phys: [id number] Tyvon Eggenberger J Nickole Adamek  IMPRESSIONS   1. Left ventricular ejection fraction, by estimation, is 60 to 65%. The left ventricle has normal function. The left ventricle has no regional wall motion abnormalities. Left ventricular diastolic parameters were normal. The average left ventricular global longitudinal strain is -20.6 %. 2. Right ventricular systolic function is normal. The right ventricular size is normal. There is normal pulmonary artery systolic pressure. 3. Right atrial size was mildly dilated. 4. The mitral valve is grossly normal. Trivial mitral valve regurgitation. No evidence of mitral stenosis. 5. The aortic valve is tricuspid. There is mild focal calcification of the noncoronary cusp of the aortic valve. There is moderate thickening of the aortic valve. Aortic valve regurgitation is not visualized. No aortic stenosis is present. 6. There is mild dilatation of the ascending aorta, measuring 37 mm. 7. The inferior vena cava is normal in size  with greater than 50% respiratory variability, suggesting right atrial pressure of 3 mmHg.  Comparison(s): No prior Echocardiogram.  FINDINGS Left Ventricle: Left ventricular ejection fraction, by estimation, is 60 to 65%. The left ventricle has normal function. The left ventricle has no regional wall motion abnormalities. The average left ventricular global longitudinal strain is -20.6 %. The left ventricular internal cavity size was normal in size. There is no left ventricular hypertrophy. Left ventricular diastolic parameters were normal.  Right Ventricle: The right ventricular size is normal. Right vetricular wall thickness was not well visualized. Right ventricular systolic function is normal. There is normal pulmonary artery systolic pressure. The tricuspid regurgitant velocity is 2.28 m/s, and with an assumed right atrial pressure of 3 mmHg, the estimated right ventricular systolic pressure is 23.8 mmHg.  Left Atrium: Left atrial size was  normal in size.  Right Atrium: Right atrial size was mildly dilated.  Pericardium: There is no evidence of pericardial effusion.  Mitral Valve: The mitral valve is grossly normal. Trivial mitral valve regurgitation. No evidence of mitral valve stenosis.  Tricuspid Valve: The tricuspid valve is not well visualized. Tricuspid valve regurgitation is mild . No evidence of tricuspid stenosis.  Aortic Valve: The aortic valve is tricuspid. There is mild focal calcification of the noncoronary cusp of the aortic valve. There is moderate thickening of the aortic valve. Aortic valve regurgitation is not visualized. No aortic stenosis is present.  Pulmonic Valve: The pulmonic valve was not well visualized. Pulmonic valve regurgitation is not visualized. No evidence of pulmonic stenosis.  Aorta: The aortic root is normal in size and structure. There is mild dilatation of the ascending aorta, measuring 37 mm.  Venous: The inferior vena cava is normal in size  with greater than 50% respiratory variability, suggesting right atrial pressure of 3 mmHg.  IAS/Shunts: The interatrial septum was not well visualized.   LEFT VENTRICLE PLAX 2D LVIDd:         4.30 cm   Diastology LVIDs:         3.40 cm   LV e' medial:    7.94 cm/s LV PW:         1.00 cm   LV E/e' medial:  8.1 LV IVS:        1.00 cm   LV e' lateral:   12.70 cm/s LVOT diam:     2.50 cm   LV E/e' lateral: 5.0 LV SV:         84 LV SV Index:   45        2D Longitudinal Strain LVOT Area:     4.91 cm  2D Strain GLS Avg:     -20.6 %   RIGHT VENTRICLE             IVC RV Basal diam:  3.60 cm     IVC diam: 1.50 cm RV Mid diam:    3.00 cm RV S prime:     15.10 cm/s TAPSE (M-mode): 2.9 cm  LEFT ATRIUM           Index        RIGHT ATRIUM           Index LA diam:      2.40 cm 1.30 cm/m   RA Area:     18.30 cm LA Vol (A2C): 37.4 ml 20.24 ml/m  RA Volume:   54.20 ml  29.33 ml/m LA Vol (A4C): 30.5 ml 16.50 ml/m AORTIC VALVE LVOT Vmax:   90.83 cm/s LVOT Vmean:  59.733 cm/s LVOT VTI:    0.171 m  AORTA Ao Root diam: 3.60 cm Ao Asc diam:  3.70 cm Ao Desc diam: 2.40 cm  MITRAL VALVE               TRICUSPID VALVE MV Area (PHT): 3.26 cm    TR Peak grad:   20.8 mmHg MV Decel Time: 233 msec    TR Vmax:        228.00 cm/s MV E velocity: 63.95 cm/s MV A velocity: 79.95 cm/s  SHUNTS MV E/A ratio:  0.80        Systemic VTI:  0.17 m Systemic Diam: 2.50 cm  Sreedhar reddy Madireddy Electronically signed by Vern Claude reddy Madireddy Signature Date/Time: 11/18/2023/5:54:47 PM    Final    CT SCANS  CT CORONARY MORPH W/CTA  COR W/SCORE 11/03/2023  Addendum 11/20/2023  8:27 PM ADDENDUM REPORT: 11/20/2023 20:24  EXAM: OVER-READ INTERPRETATION  CT CHEST  The following report is an over-read performed by radiologist Dr. Aram Candela of [facility] on 11/20/2023. This over-read does not include interpretation of cardiac or coronary anatomy or pathology. The coronary  calcium score/coronary CTA interpretation by the cardiologist is attached.  COMPARISON:  December 31, 2020  FINDINGS: Cardiovascular: There are no significant extracardiac vascular findings.  Mediastinum/Nodes: There are no enlarged lymph nodes within the visualized mediastinum.  Lungs/Pleura: There is no pleural effusion. The visualized lungs appear clear.  Upper abdomen: No significant findings in the visualized upper abdomen.  Musculoskeletal/Chest wall: No chest wall mass or suspicious osseous findings within the visualized chest.  IMPRESSION: No significant extracardiac findings within the visualized chest.   Electronically Signed By: Aram Candela M.D. On: 11/20/2023 20:24  Narrative CLINICAL DATA:  69 year old with chest pain  EXAM: Cardiac/Coronary  CTA  TECHNIQUE: The patient was scanned on a [employer].  FINDINGS: A 120 kV prospective scan was triggered in the descending thoracic aorta at 111 HU's. Axial non-contrast 3 mm slices were carried out through the heart. The data set was analyzed on a dedicated work station and scored using the Agatson method. Gantry rotation speed was 250 msecs and collimation was .6 mm. 0.8 mg of sl NTG was given. The 3D data set was reconstructed in 5% intervals of the 67-82 % of the R-R cycle. Diastolic phases were analyzed on a dedicated work station using MPR, MIP and VRT modes. The patient received 80 cc of contrast.  Image quality: good  Aorta: Normal size. No calcifications. There is a small faint linear signal in the descending aorta that is likely artifactual (not visualized in 35% reconstruction).  Aortic Valve:  Trileaflet.  No calcifications.  Coronary Arteries:  Normal coronary origin.  Right dominance.  RCA is a large dominant artery that gives rise to PDA and PLA. There is no plaque.  Left main is a large artery that gives rise to LAD and LCX arteries.  LAD is a large vessel that has  proximal calcified plaque, 0 to 24% stenosis, mid low-attenuation plaque 50 to 69% stenosis at the bifurcation of diagonal branch. FFR is 0.88 post lesion, non flow limiting.  LCX is a non-dominant artery.  There is no plaque.  OM1-small caliber  Other findings:  Normal pulmonary vein drainage into the left atrium.  Normal left atrial appendage without a thrombus.  Normal size of the pulmonary artery.  Please see radiology report for non cardiac findings.  IMPRESSION: 1. Coronary calcium score of 42. This was 73 percentile for age and sex matched control.  2. Total plaque volume (TPV) 103 mm3 which is 22nd percentile for age-and sex matched controls (calcified plaque 6 mm3; non-calcified plaque 97 mm3). TPV is mild.  3. Normal coronary origin with right dominance.  4. Moderate non flow limiting CAD of LAD just prior to diagonal branch, FFR 0.88 post lesion.  Electronically Signed: By: Donato Schultz M.D. On: 11/04/2023 12:19              Recent Labs: 10/15/2023: BUN 20; Creatinine, Ser 1.10; Potassium 4.9; Sodium 139  Recent Lipid Panel    Component Value Date/Time   CHOL 144 01/04/2024 0949   TRIG 80 01/04/2024 0949   HDL 57 01/04/2024 0949   CHOLHDL 2.5 01/04/2024 0949   LDLCALC 72 01/04/2024 0949    Physical  Exam:    VS:  BP 108/70 (BP Location: Right Arm, Patient Position: Sitting)   Pulse 91   Ht 5\' 10"  (1.778 m)   Wt 152 lb 6.4 oz (69.1 kg)   SpO2 91%   BMI 21.87 kg/m     Wt Readings from Last 3 Encounters:  01/15/24 152 lb 6.4 oz (69.1 kg)  10/15/23 150 lb 3.2 oz (68.1 kg)  09/30/23 149 lb (67.6 kg)     GEN:  Well nourished, well developed in no acute distress HEENT: Normal NECK: No JVD; No carotid bruits LYMPHATICS: No lymphadenopathy CARDIAC: RRR, no murmurs, rubs, gallops RESPIRATORY:  Clear to auscultation without rales, wheezing or rhonchi  ABDOMEN: Soft, non-tender, non-distended MUSCULOSKELETAL:  No edema; No deformity  SKIN:  Warm and dry NEUROLOGIC:  Alert and oriented x 3 PSYCHIATRIC:  Normal affect    Signed, Norman Herrlich, MD  01/15/2024 8:27 AM    [facility]

## 2024-01-15 ENCOUNTER — Other Ambulatory Visit (HOSPITAL_COMMUNITY): Payer: Self-pay

## 2024-01-15 ENCOUNTER — Ambulatory Visit: Payer: PPO | Attending: Cardiology | Admitting: Cardiology

## 2024-01-15 ENCOUNTER — Telehealth: Payer: Self-pay | Admitting: Pharmacy Technician

## 2024-01-15 ENCOUNTER — Encounter: Payer: Self-pay | Admitting: Cardiology

## 2024-01-15 ENCOUNTER — Other Ambulatory Visit: Payer: Self-pay

## 2024-01-15 VITALS — BP 108/70 | HR 91 | Ht 70.0 in | Wt 152.4 lb

## 2024-01-15 DIAGNOSIS — I25118 Atherosclerotic heart disease of native coronary artery with other forms of angina pectoris: Secondary | ICD-10-CM

## 2024-01-15 DIAGNOSIS — R931 Abnormal findings on diagnostic imaging of heart and coronary circulation: Secondary | ICD-10-CM

## 2024-01-15 DIAGNOSIS — E7841 Elevated Lipoprotein(a): Secondary | ICD-10-CM

## 2024-01-15 DIAGNOSIS — I4719 Other supraventricular tachycardia: Secondary | ICD-10-CM

## 2024-01-15 DIAGNOSIS — I4510 Unspecified right bundle-branch block: Secondary | ICD-10-CM

## 2024-01-15 NOTE — Telephone Encounter (Signed)
Pharmacy Patient Advocate Encounter   Received notification from Physician's Office that prior authorization for repatha is required/requested.   Insurance verification completed.   The patient is insured through [facility] ADVANTAGE/RX ADVANCE .   Per test claim: PA required; PA submitted to above mentioned insurance via CoverMyMeds Key/confirmation #/EOC [employer] Status is pending

## 2024-01-15 NOTE — Telephone Encounter (Signed)
Pharmacy Patient Advocate Encounter  Received notification from [facility] ADVANTAGE/RX ADVANCE that Prior Authorization for repatha has been DENIED.  No reason given; No denial letter received via Fax or CMM. It has been requested and will be uploaded to the media tab once received.   PA #/Case ID/Reference #: [employer]

## 2024-01-15 NOTE — Patient Instructions (Addendum)
Medication Instructions:  Your physician recommends that you continue on your current medications as directed. Please refer to the Current Medication list given to you today.  *If you need a refill on your cardiac medications before your next appointment, please call your pharmacy*   Lab Work: Your physician recommends that you return for lab work in:   Labs in 2 months: Lipids, Lpa, Apo B  If you have labs (blood work) drawn today and your tests are completely normal, you will receive your results only by: MyChart Message (if you have MyChart) OR A paper copy in the mail If you have any lab test that is abnormal or we need to change your treatment, we will call you to review the results.   Testing/Procedures: None   Follow-Up: At [facility], you and your health needs are our priority.  As part of our continuing mission to provide you with exceptional heart care, we have created designated Provider Care Teams.  These Care Teams include your primary Cardiologist (physician) and Advanced Practice Providers (APPs -  Physician Assistants and Nurse Practitioners) who all work together to provide you with the care you need, when you need it.  We recommend signing up for the patient portal called "MyChart".  Sign up information is provided on this After Visit Summary.  MyChart is used to connect with patients for Virtual Visits (Telemedicine).  Patients are able to view lab/test results, encounter notes, upcoming appointments, etc.  Non-urgent messages can be sent to your provider as well.   To learn more about what you can do with MyChart, go to [email or web address].    Your next appointment:   1 year(s)  Provider:   Norman Herrlich, MD    Other Instructions None

## 2024-01-15 NOTE — Telephone Encounter (Signed)
-----   Message from Nurse Gerlene Burdock C sent at 01/15/2024  8:42 AM EST ----- This patient needs a prior auth for his Repatha medication.   Thanks.

## 2024-01-19 ENCOUNTER — Telehealth: Payer: Self-pay | Admitting: Cardiology

## 2024-01-19 NOTE — Telephone Encounter (Signed)
Pt is calling to see if he was approved for Repatha. Please Advise

## 2024-01-20 ENCOUNTER — Other Ambulatory Visit: Payer: Self-pay

## 2024-01-20 DIAGNOSIS — E785 Hyperlipidemia, unspecified: Secondary | ICD-10-CM

## 2024-01-20 MED ORDER — EZETIMIBE 10 MG PO TABS: 10.0000 mg | ORAL_TABLET | Freq: Every day | ORAL | 3 refills | Status: DC

## 2024-01-20 NOTE — Telephone Encounter (Signed)
Called the patient and informed him of Dr. Hulen Shouts recommendation below:  "Zetia 10 mg daily along with his statin lipid profile 2 months"   Patient verbalized understanding and had no further questions at this time. Ezetimide medication was ordered via Epic and sent to his pharmacy. Labs ordered via Epic.

## 2024-03-24 ENCOUNTER — Other Ambulatory Visit: Payer: Self-pay | Admitting: Cardiology

## 2024-03-24 ENCOUNTER — Telehealth: Payer: Self-pay | Admitting: *Deleted

## 2024-03-24 DIAGNOSIS — E785 Hyperlipidemia, unspecified: Secondary | ICD-10-CM

## 2024-03-24 DIAGNOSIS — E7841 Elevated Lipoprotein(a): Secondary | ICD-10-CM

## 2024-03-24 NOTE — Telephone Encounter (Signed)
 Pt needs additional labs per notes: LPa and Apolipoprotein B

## 2024-03-25 LAB — LIPID PANEL
Chol/HDL Ratio: 2.1 ratio (ref 0.0–5.0)
Cholesterol, Total: 116 mg/dL (ref 100–199)
HDL: 55 mg/dL (ref >39)
LDL Chol Calc (NIH): 49 mg/dL (ref 0–99)
Triglycerides: 54 mg/dL (ref 0–149)
VLDL Cholesterol Cal: 12 mg/dL (ref 5–40)

## 2024-10-14 ENCOUNTER — Other Ambulatory Visit: Payer: Self-pay | Admitting: Cardiology

## 2024-10-28 ENCOUNTER — Other Ambulatory Visit: Payer: Self-pay | Admitting: Cardiology

## 2025-01-04 ENCOUNTER — Other Ambulatory Visit: Payer: Self-pay

## 2025-01-10 ENCOUNTER — Other Ambulatory Visit: Payer: Self-pay

## 2025-01-10 DIAGNOSIS — E782 Mixed hyperlipidemia: Secondary | ICD-10-CM

## 2025-01-11 ENCOUNTER — Encounter: Payer: Self-pay | Admitting: Cardiology

## 2025-01-11 LAB — LIPID PANEL
Chol/HDL Ratio: 2.2 ratio (ref 0.0–5.0)
Cholesterol, Total: 121 mg/dL (ref 100–199)
HDL: 56 mg/dL
LDL Chol Calc (NIH): 51 mg/dL (ref 0–99)
Triglycerides: 65 mg/dL (ref 0–149)
VLDL Cholesterol Cal: 14 mg/dL (ref 5–40)

## 2025-01-11 LAB — APOLIPOPROTEIN B: Apolipoprotein B: 58 mg/dL

## 2025-01-11 LAB — LIPOPROTEIN A (LPA): Lipoprotein (a): 114.3 nmol/L — ABNORMAL HIGH

## 2025-01-16 NOTE — Progress Notes (Unsigned)
 " Cardiology Office Note:    Date:  01/17/2025   ID:  Travis Trujillo, DOB 1956-08-01, MRN [id number]  PCP:  Silver Lamar LABOR, MD  Cardiologist:  Redell Leiter, MD    Referring MD: Silver Lamar LABOR, MD    ASSESSMENT:    1. Coronary artery disease of native artery of native heart with stable angina pectoris   2. Agatston coronary artery calcium  score less than 100   3. PAT (paroxysmal atrial tachycardia)   4. Mixed hyperlipidemia   5. Elevated lipoprotein(a)    PLAN:    In order of problems listed above:  He has moderate nonflow-limiting stenosis CAD having no anginal discomfort continue his medical therapy including aspirin lipid-lowering with high intensity statin and Zetia . He is also taking over-the-counter fish oil No recurrent atrial tachycardia clinically   Next appointment: 1 year   Medication Adjustments/Labs and Tests Ordered: Current medicines are reviewed at length with the patient today.  Concerns regarding medicines are outlined above.  Orders Placed This Encounter  Procedures   EKG 12-Lead   Meds ordered this encounter  Medications   rosuvastatin  (CRESTOR ) 20 MG tablet    Sig: Take 1 tablet (20 mg total) by mouth daily.    Dispense:  90 tablet    Refill:  3   ezetimibe  (ZETIA ) 10 MG tablet    Sig: Take 1 tablet (10 mg total) by mouth daily.    Dispense:  90 tablet    Refill:  3     History of Present Illness:    Travis Trujillo is a 69 y.o. male with a hx of CAD with coronary calcium  score 42 and moderate nonflow-limiting stenosis left anterior descending coronary artery 50 to 69% atrial tachycardia incomplete right bundle branch block and hyperlipidemia with elevated Lpa last seen 01/15/2024. Compliance with diet, lifestyle and medications: Yes Is feeling much better and having rapid heart rate exercise intolerance edema shortness of breath or chest pain He tolerates his lipid-lowering therapy without muscle pain or weakness and his lipids are ideal  with an LDL of 51  Past Medical History:  Diagnosis Date   Acute gastritis without hemorrhage 10/02/2021   Atypical chest pain 10/02/2021   Irregular cardiac rhythm    SVT (supraventricular tachycardia)    Yeast dermatitis 10/02/2021    Current Medications: Active Medications[1]    EKGs/Labs/Other Studies Reviewed:    The following studies were reviewed today:  Cardiac Studies & Procedures   ______________________________________________________________________________________________     ECHOCARDIOGRAM  ECHOCARDIOGRAM COMPLETE 11/18/2023  Narrative ECHOCARDIOGRAM REPORT    Patient Name:   Travis Trujillo Date of Exam: 11/18/2023 Medical Rec #:  [id number]    Height:       70.0 in Accession #:    [id number]   Weight:       150.2 lb Date of Birth:  [birth year]   BSA:          1.848 m Patient Age:    69 years     BP:           99/77 mmHg Patient Gender: M            HR:           80 bpm. Exam Location:  [place]  Procedure: 2D Echo, Cardiac Doppler, Color Doppler and Strain Analysis  Indications:    SVT (supraventricular tachycardia) (HCC) [I47.10 (ICD-10-CM)]; Precordial pain [R07.2 (ICD-10-CM)]; SOB (shortness of breath) [R06.02 (ICD-10-CM)]; Syncope and collapse DISCORDIA.DIESEL (ICD-10-CM)]; Palpitations [R00.2 (ICD-10-CM)]  History:  Patient has no prior history of Echocardiogram examinations. Arrythmias:SVT and RBBB; Signs/Symptoms:Shortness of Breath and Syncope.  Sonographer:    Charlie Jointer RDCS Referring Phys: [id number] Bensen [place] J Reaghan Kawa  IMPRESSIONS   1. Left ventricular ejection fraction, by estimation, is 60 to 65%. The left ventricle has normal function. The left ventricle has no regional wall motion abnormalities. Left ventricular diastolic parameters were normal. The average left ventricular global longitudinal strain is -20.6 %. 2. Right ventricular systolic function is normal. The right ventricular size is normal. There is normal pulmonary artery  systolic pressure. 3. Right atrial size was mildly dilated. 4. The mitral valve is grossly normal. Trivial mitral valve regurgitation. No evidence of mitral stenosis. 5. The aortic valve is tricuspid. There is mild focal calcification of the noncoronary cusp of the aortic valve. There is moderate thickening of the aortic valve. Aortic valve regurgitation is not visualized. No aortic stenosis is present. 6. There is mild dilatation of the ascending aorta, measuring 37 mm. 7. The inferior vena cava is normal in size with greater than 50% respiratory variability, suggesting right atrial pressure of 3 mmHg.  Comparison(s): No prior Echocardiogram.  FINDINGS Left Ventricle: Left ventricular ejection fraction, by estimation, is 60 to 65%. The left ventricle has normal function. The left ventricle has no regional wall motion abnormalities. The average left ventricular global longitudinal strain is -20.6 %. The left ventricular internal cavity size was normal in size. There is no left ventricular hypertrophy. Left ventricular diastolic parameters were normal.  Right Ventricle: The right ventricular size is normal. Right vetricular wall thickness was not well visualized. Right ventricular systolic function is normal. There is normal pulmonary artery systolic pressure. The tricuspid regurgitant velocity is 2.28 m/s, and with an assumed right atrial pressure of 3 mmHg, the estimated right ventricular systolic pressure is 23.8 mmHg.  Left Atrium: Left atrial size was normal in size.  Right Atrium: Right atrial size was mildly dilated.  Pericardium: There is no evidence of pericardial effusion.  Mitral Valve: The mitral valve is grossly normal. Trivial mitral valve regurgitation. No evidence of mitral valve stenosis.  Tricuspid Valve: The tricuspid valve is not well visualized. Tricuspid valve regurgitation is mild . No evidence of tricuspid stenosis.  Aortic Valve: The aortic valve is tricuspid. There  is mild focal calcification of the noncoronary cusp of the aortic valve. There is moderate thickening of the aortic valve. Aortic valve regurgitation is not visualized. No aortic stenosis is present.  Pulmonic Valve: The pulmonic valve was not well visualized. Pulmonic valve regurgitation is not visualized. No evidence of pulmonic stenosis.  Aorta: The aortic root is normal in size and structure. There is mild dilatation of the ascending aorta, measuring 37 mm.  Venous: The inferior vena cava is normal in size with greater than 50% respiratory variability, suggesting right atrial pressure of 3 mmHg.  IAS/Shunts: The interatrial septum was not well visualized.   LEFT VENTRICLE PLAX 2D LVIDd:         4.30 cm   Diastology LVIDs:         3.40 cm   LV e' medial:    7.94 cm/s LV PW:         1.00 cm   LV E/e' medial:  8.1 LV IVS:        1.00 cm   LV e' lateral:   12.70 cm/s LVOT diam:     2.50 cm   LV E/e' lateral: 5.0 LV SV:  84 LV SV Index:   45        2D Longitudinal Strain LVOT Area:     4.91 cm  2D Strain GLS Avg:     -20.6 %   RIGHT VENTRICLE             IVC RV Basal diam:  3.60 cm     IVC diam: 1.50 cm RV Mid diam:    3.00 cm RV S prime:     15.10 cm/s TAPSE (M-mode): 2.9 cm  LEFT ATRIUM           Index        RIGHT ATRIUM           Index LA diam:      2.40 cm 1.30 cm/m   RA Area:     18.30 cm LA Vol (A2C): 37.4 ml 20.24 ml/m  RA Volume:   54.20 ml  29.33 ml/m LA Vol (A4C): 30.5 ml 16.50 ml/m AORTIC VALVE LVOT Vmax:   90.83 cm/s LVOT Vmean:  59.733 cm/s LVOT VTI:    0.171 m  AORTA Ao Root diam: 3.60 cm Ao Asc diam:  3.70 cm Ao Desc diam: 2.40 cm  MITRAL VALVE               TRICUSPID VALVE MV Area (PHT): 3.26 cm    TR Peak grad:   20.8 mmHg MV Decel Time: 233 msec    TR Vmax:        228.00 cm/s MV E velocity: 63.95 cm/s MV A velocity: 79.95 cm/s  SHUNTS MV E/A ratio:  0.80        Systemic VTI:  0.17 m Systemic Diam: 2.50 cm  Sreedhar reddy  Madireddy Electronically signed by Alean reddy Madireddy Signature Date/Time: 11/18/2023/5:54:47 PM    Final      CT SCANS  CT CORONARY MORPH W/CTA COR W/SCORE 11/03/2023  Addendum 11/20/2023  8:27 PM ADDENDUM REPORT: 11/20/2023 20:24  EXAM: OVER-READ INTERPRETATION  CT CHEST  The following report is an over-read performed by radiologist Dr. Suzen Dials of [facility] on 11/20/2023. This over-read does not include interpretation of cardiac or coronary anatomy or pathology. The coronary calcium  score/coronary CTA interpretation by the cardiologist is attached.  COMPARISON:  December 31, 2020  FINDINGS: Cardiovascular: There are no significant extracardiac vascular findings.  Mediastinum/Nodes: There are no enlarged lymph nodes within the visualized mediastinum.  Lungs/Pleura: There is no pleural effusion. The visualized lungs appear clear.  Upper abdomen: No significant findings in the visualized upper abdomen.  Musculoskeletal/Chest wall: No chest wall mass or suspicious osseous findings within the visualized chest.  IMPRESSION: No significant extracardiac findings within the visualized chest.   Electronically Signed By: Suzen Dials M.D. On: 11/20/2023 20:24  Narrative CLINICAL DATA:  69 year old with chest pain  EXAM: Cardiac/Coronary  CTA  TECHNIQUE: The patient was scanned on a [employer].  FINDINGS: A 120 kV prospective scan was triggered in the descending thoracic aorta at 111 HU's. Axial non-contrast 3 mm slices were carried out through the heart. The data set was analyzed on a dedicated work station and scored using the Agatson method. Gantry rotation speed was 250 msecs and collimation was .6 mm. 0.8 mg of sl NTG was given. The 3D data set was reconstructed in 5% intervals of the 67-82 % of the R-R cycle. Diastolic phases were analyzed on a dedicated work station using MPR, MIP and VRT modes. The  patient received 80 cc  of contrast.  Image quality: good  Aorta: Normal size. No calcifications. There is a small faint linear signal in the descending aorta that is likely artifactual (not visualized in 35% reconstruction).  Aortic Valve:  Trileaflet.  No calcifications.  Coronary Arteries:  Normal coronary origin.  Right dominance.  RCA is a large dominant artery that gives rise to PDA and PLA. There is no plaque.  Left main is a large artery that gives rise to LAD and LCX arteries.  LAD is a large vessel that has proximal calcified plaque, 0 to 24% stenosis, mid low-attenuation plaque 50 to 69% stenosis at the bifurcation of diagonal branch. FFR is 0.88 post lesion, non flow limiting.  LCX is a non-dominant artery.  There is no plaque.  OM1-small caliber  Other findings:  Normal pulmonary vein drainage into the left atrium.  Normal left atrial appendage without a thrombus.  Normal size of the pulmonary artery.  Please see radiology report for non cardiac findings.  IMPRESSION: 1. Coronary calcium  score of 42. This was 58 percentile for age and sex matched control.  2. Total plaque volume (TPV) 103 mm3 which is 22nd percentile for age-and sex matched controls (calcified plaque 6 mm3; non-calcified plaque 97 mm3). TPV is mild.  3. Normal coronary origin with right dominance.  4. Moderate non flow limiting CAD of LAD just prior to diagonal branch, FFR 0.88 post lesion.  Electronically Signed: By: Oneil Parchment M.D. On: 11/04/2023 12:19     ______________________________________________________________________________________________      EKG Interpretation Date/Time:  Tuesday January 17 2025 13:56:46 EST Ventricular Rate:  74 PR Interval:  140 QRS Duration:  110 QT Interval:  396 QTC Calculation: 439 R Axis:   65  Text Interpretation: Normal sinus rhythm Incomplete right bundle branch block When compared with ECG of 15-Oct-2023 08:38, No significant  change was found Confirmed by Monetta Rogue ([id number]) on 01/17/2025 2:14:37 PM   Recent Labs: No results found for requested labs within last 365 days.  Recent Lipid Panel    Component Value Date/Time   CHOL 121 01/10/2025 0942   TRIG 65 01/10/2025 0942   HDL 56 01/10/2025 0942   CHOLHDL 2.2 01/10/2025 0942   LDLCALC 51 01/10/2025 0942    Physical Exam:    VS:  BP 124/74   Pulse 74   Ht 5' 10 (1.778 m)   Wt 153 lb (69.4 kg)   SpO2 99%   BMI 21.95 kg/m     Wt Readings from Last 3 Encounters:  01/17/25 153 lb (69.4 kg)  01/15/24 152 lb 6.4 oz (69.1 kg)  10/15/23 150 lb 3.2 oz (68.1 kg)     GEN:  Well nourished, well developed in no acute distress HEENT: Normal NECK: No JVD; No carotid bruits LYMPHATICS: No lymphadenopathy CARDIAC: RRR, no murmurs, rubs, gallops RESPIRATORY:  Clear to auscultation without rales, wheezing or rhonchi  ABDOMEN: Soft, non-tender, non-distended MUSCULOSKELETAL:  No edema; No deformity  SKIN: Warm and dry NEUROLOGIC:  Alert and oriented x 3 PSYCHIATRIC:  Normal affect    Signed, Rogue Monetta, MD  01/17/2025 2:30 PM     [facility]      [1]  Current Meds  Medication Sig   aspirin EC 81 MG tablet Take 81 mg by mouth daily.   famotidine (PEPCID) 40 MG tablet Take 40 mg by mouth daily.   Omega-3 1000 MG CAPS Take 1 capsule by mouth daily.   zinc gluconate 50 MG tablet Take 50  mg by mouth daily.   [DISCONTINUED] ezetimibe  (ZETIA ) 10 MG tablet Take 1 tablet (10 mg total) by mouth daily.   [DISCONTINUED] rosuvastatin  (CRESTOR ) 20 MG tablet Take 1 tablet (20 mg total) by mouth daily.   "

## 2025-01-17 ENCOUNTER — Encounter: Payer: Self-pay | Admitting: Cardiology

## 2025-01-17 ENCOUNTER — Ambulatory Visit: Attending: Cardiology | Admitting: Cardiology

## 2025-01-17 VITALS — BP 124/74 | HR 74 | Ht 70.0 in | Wt 153.0 lb

## 2025-01-17 DIAGNOSIS — I25118 Atherosclerotic heart disease of native coronary artery with other forms of angina pectoris: Secondary | ICD-10-CM | POA: Diagnosis not present

## 2025-01-17 DIAGNOSIS — E7841 Elevated Lipoprotein(a): Secondary | ICD-10-CM | POA: Diagnosis not present

## 2025-01-17 DIAGNOSIS — R931 Abnormal findings on diagnostic imaging of heart and coronary circulation: Secondary | ICD-10-CM | POA: Diagnosis not present

## 2025-01-17 DIAGNOSIS — E782 Mixed hyperlipidemia: Secondary | ICD-10-CM

## 2025-01-17 DIAGNOSIS — I4719 Other supraventricular tachycardia: Secondary | ICD-10-CM | POA: Diagnosis not present

## 2025-01-17 MED ORDER — ROSUVASTATIN CALCIUM 20 MG PO TABS: 20.0000 mg | ORAL_TABLET | Freq: Every day | ORAL | 3 refills | Status: AC

## 2025-01-17 MED ORDER — EZETIMIBE 10 MG PO TABS: 10.0000 mg | ORAL_TABLET | Freq: Every day | ORAL | 3 refills | Status: AC

## 2025-01-17 NOTE — Patient Instructions (Addendum)
 Medication Instructions:  Your physician recommends that you continue on your current medications as directed. Please refer to the Current Medication list given to you today.  *If you need a refill on your cardiac medications before your next appointment, please call your pharmacy*   Lab Work: None ordered If you have labs (blood work) drawn today and your tests are completely normal, you will receive your results only by: MyChart Message (if you have MyChart) OR A paper copy in the mail If you have any lab test that is abnormal or we need to change your treatment, we will call you to review the results.   Testing/Procedures: None ordered   Follow-Up: At [facility], you and your health needs are our priority.  As part of our continuing mission to provide you with exceptional heart care, we have created designated Provider Care Teams.  These Care Teams include your primary Cardiologist (physician) and Advanced Practice Providers (APPs -  Physician Assistants and Nurse Practitioners) who all work together to provide you with the care you need, when you need it.  We recommend signing up for the patient portal called MyChart.  Sign up information is provided on this After Visit Summary.  MyChart is used to connect with patients for Virtual Visits (Telemedicine).  Patients are able to view lab/test results, encounter notes, upcoming appointments, etc.  Non-urgent messages can be sent to your provider as well.   To learn more about what you can do with MyChart, go to [email or web address].    Your next appointment:   12 month(s)  The format for your next appointment:   In Person  Provider:   Redell Leiter, MD    Other Instructions            Important Information About Sugar
